# Patient Record
Sex: Male | Born: 1990 | Race: Black or African American | Hispanic: No | Marital: Single | Smoking: Never smoker
Health system: Southern US, Community
[De-identification: ages and names within clinical notes are randomized; demographics above are authoritative.]

## PROBLEM LIST (undated history)

## (undated) DIAGNOSIS — A549 Gonococcal infection, unspecified: Secondary | ICD-10-CM

## (undated) DIAGNOSIS — L309 Dermatitis, unspecified: Secondary | ICD-10-CM

## (undated) DIAGNOSIS — A749 Chlamydial infection, unspecified: Secondary | ICD-10-CM

---
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
## 2009-07-13 ENCOUNTER — Emergency Department (HOSPITAL_COMMUNITY): Admission: EM | Admit: 2009-07-13 | Discharge: 2009-07-13 | Payer: Self-pay | Admitting: Emergency Medicine

## 2009-08-28 ENCOUNTER — Emergency Department (HOSPITAL_COMMUNITY): Admission: EM | Admit: 2009-08-28 | Discharge: 2009-08-28 | Payer: Self-pay | Admitting: Emergency Medicine

## 2009-12-10 ENCOUNTER — Emergency Department (HOSPITAL_COMMUNITY): Admission: EM | Admit: 2009-12-10 | Discharge: 2009-12-10 | Payer: Self-pay | Admitting: Emergency Medicine

## 2010-03-14 ENCOUNTER — Emergency Department (HOSPITAL_COMMUNITY): Admission: EM | Admit: 2010-03-14 | Discharge: 2010-03-14 | Payer: Self-pay | Admitting: Family Medicine

## 2010-07-08 LAB — GC/CHLAMYDIA PROBE AMP, GENITAL
Chlamydia, DNA Probe: POSITIVE — AB
GC Probe Amp, Genital: NEGATIVE

## 2010-07-10 LAB — GC/CHLAMYDIA PROBE AMP, GENITAL: Chlamydia, DNA Probe: NEGATIVE

## 2010-07-22 ENCOUNTER — Inpatient Hospital Stay (INDEPENDENT_AMBULATORY_CARE_PROVIDER_SITE_OTHER)
Admission: RE | Admit: 2010-07-22 | Discharge: 2010-07-22 | Disposition: A | Discharge status: Home / Self Care | Payer: Self-pay | Source: Ambulatory Visit | Attending: Family Medicine | Admitting: Family Medicine

## 2010-07-22 DIAGNOSIS — R6889 Other general symptoms and signs: Secondary | ICD-10-CM

## 2010-07-28 ENCOUNTER — Inpatient Hospital Stay (INDEPENDENT_AMBULATORY_CARE_PROVIDER_SITE_OTHER)
Admission: RE | Admit: 2010-07-28 | Discharge: 2010-07-28 | Disposition: A | Discharge status: Home / Self Care | Payer: Self-pay | Source: Ambulatory Visit

## 2010-07-28 DIAGNOSIS — R6889 Other general symptoms and signs: Secondary | ICD-10-CM

## 2010-07-28 LAB — URINALYSIS, MICROSCOPIC ONLY
Bilirubin Urine: NEGATIVE
Ketones, ur: NEGATIVE mg/dL
Leukocytes, UA: NEGATIVE
Urobilinogen, UA: 0.2 mg/dL (ref 0.0–1.0)
pH: 5.5 (ref 5.0–8.0)

## 2010-07-29 LAB — GC/CHLAMYDIA PROBE AMP, GENITAL
Chlamydia, DNA Probe: NEGATIVE
GC Probe Amp, Genital: NEGATIVE

## 2010-10-05 ENCOUNTER — Inpatient Hospital Stay (INDEPENDENT_AMBULATORY_CARE_PROVIDER_SITE_OTHER)
Admission: RE | Admit: 2010-10-05 | Discharge: 2010-10-05 | Disposition: A | Discharge status: Home / Self Care | Payer: Self-pay | Source: Ambulatory Visit | Attending: Family Medicine | Admitting: Family Medicine

## 2010-10-05 DIAGNOSIS — R369 Urethral discharge, unspecified: Secondary | ICD-10-CM

## 2010-10-07 LAB — GC/CHLAMYDIA PROBE AMP, GENITAL
Chlamydia, DNA Probe: NEGATIVE
GC Probe Amp, Genital: NEGATIVE

## 2010-11-17 ENCOUNTER — Inpatient Hospital Stay (HOSPITAL_COMMUNITY)
Admission: RE | Admit: 2010-11-17 | Discharge: 2010-11-17 | Disposition: A | Discharge status: Home / Self Care | Payer: Self-pay | Source: Ambulatory Visit | Attending: Emergency Medicine | Admitting: Emergency Medicine

## 2011-02-04 LAB — GC/CHLAMYDIA PROBE AMP, GENITAL: GC Probe Amp, Genital: POSITIVE — AB

## 2011-07-06 ENCOUNTER — Encounter (HOSPITAL_COMMUNITY): Payer: Self-pay

## 2011-07-06 ENCOUNTER — Emergency Department (INDEPENDENT_AMBULATORY_CARE_PROVIDER_SITE_OTHER)
Admission: EM | Admit: 2011-07-06 | Discharge: 2011-07-06 | Disposition: A | Discharge status: Home / Self Care | Payer: Self-pay | Source: Home / Self Care | Attending: Emergency Medicine | Admitting: Emergency Medicine

## 2011-07-06 DIAGNOSIS — A749 Chlamydial infection, unspecified: Secondary | ICD-10-CM

## 2011-07-06 DIAGNOSIS — A7489 Other chlamydial diseases: Secondary | ICD-10-CM

## 2011-07-06 HISTORY — DX: Chlamydial infection, unspecified: A74.9

## 2011-07-06 HISTORY — DX: Dermatitis, unspecified: L30.9

## 2011-07-06 HISTORY — DX: Gonococcal infection, unspecified: A54.9

## 2011-07-06 LAB — URINE MICROSCOPIC-ADD ON

## 2011-07-06 LAB — URINALYSIS, ROUTINE W REFLEX MICROSCOPIC: Glucose, UA: NEGATIVE mg/dL

## 2011-07-06 MED ORDER — CEFTRIAXONE SODIUM 250 MG IJ SOLR
INTRAMUSCULAR | Status: AC
  Filled 2011-07-06: qty 250

## 2011-07-06 MED ORDER — AZITHROMYCIN 250 MG PO TABS
1000.0000 mg | ORAL_TABLET | Freq: Once | ORAL | Status: AC
  Administered 2011-07-06: 1000 mg via ORAL

## 2011-07-06 MED ORDER — CEFTRIAXONE SODIUM 250 MG IJ SOLR: 250.0000 mg | Freq: Once | INTRAMUSCULAR | Status: DC

## 2011-07-06 MED ORDER — AZITHROMYCIN 250 MG PO TABS
ORAL_TABLET | ORAL | Status: AC
  Filled 2011-07-06: qty 4

## 2011-07-06 MED ORDER — LIDOCAINE HCL (PF) 1 % IJ SOLN
INTRAMUSCULAR | Status: AC
  Filled 2011-07-06: qty 5

## 2011-07-06 NOTE — ED Notes (Signed)
states his partner informed him last week of chlamydia finding; pt states he is not having any symptoms; also out of his skin cream for excema

## 2011-07-06 NOTE — Discharge Instructions (Signed)
Take the medication as written. Give [place] a working phone number so that we can contact you if needed. Refrain from sexual contact until you know your results and your partner(s) are treated. Return if you get worse, have a fever >100.4, or for any concerns.   Go to www.goodrx.com to look up your medications. This will give you a list of where you can find your prescriptions at the most affordable prices.    Go to www.goodrx.com to look up your medications. This will give you a list of where you can find your prescriptions at the most affordable prices.   RESOURCE GUIDE   Insufficient Money for Medicine Contact United Way:  call "211" or [facility] [phone].  No Primary Care Doctor Call [facility]  [phone] Other agencies that provide inexpensive medical care    [facility]  [phone]    [facility]  [phone]    [facility]  [phone]    [facility]  [phone] [address] [place] [address]    [facility]  [phone]    [facility]  [phone] [facility] [phone]   [address], [place] [place], [place] [address]   [facility] Resources  [facility]     [employer]                          [facility] Dept. [address]  [place]                       [address]      [address] [place] [address]   [phone] (After Hours)

## 2011-07-06 NOTE — ED Notes (Signed)
Please call pt at : [phone] for lab reports

## 2011-07-06 NOTE — ED Provider Notes (Signed)
History     CSN: [id number]  Arrival date & time 07/06/11  1021   First MD Initiated Contact with Patient 07/06/11 1200      Chief Complaint  Patient presents with  . Rash    (Consider location/radiation/quality/duration/timing/severity/associated sxs/prior treatment) HPI Comments: Pt states that his male sexual partner was diagnosed with Chlamydia last week. Does not know if she has any other diagnosis. Patient without symptoms - No urgency, frequency, dysuria, oderous urine, hematuria,  genital blisters, penile itching. No fevers, N/V, abd pain, back pain.  Intermittently uses  condoms. STD's  a concern today. Patient history of Chlamydia, gonorrhea. No history of trichomoniasis, syphilis, herpes, HIV.   ROS as noted in HPI. All other ROS negative.     Patient is a 21 y.o. male presenting with STD exposure. The history is provided by the patient.  Exposure to STD This is a new problem. The current episode started more than 1 week ago. The problem has not changed since onset.Pertinent negatives include no chest pain and no abdominal pain. The symptoms are aggravated by nothing. The symptoms are relieved by nothing. He has tried nothing for the symptoms. The treatment provided no relief.    Past Medical History  Diagnosis Date  . Gonorrhea   . Chlamydia   . Eczema     History reviewed. No pertinent past surgical history.  History reviewed. No pertinent family history.  History  Substance Use Topics  . Smoking status: Never Smoker   . Smokeless tobacco: Not on file  . Alcohol Use: No      Review of Systems  Cardiovascular: Negative for chest pain.  Gastrointestinal: Negative for abdominal pain.    Allergies  Review of patient's allergies indicates no known allergies.  Home Medications  No current outpatient prescriptions on file.  BP 109/64  Pulse 68  Temp(Src) 97.2 F (36.2 C) (Oral)  Resp 16  SpO2 96%  Physical Exam  Nursing note and vitals  reviewed. Constitutional: He is oriented to person, place, and time. He appears well-developed and well-nourished.  HENT:  Head: Normocephalic and atraumatic.  Eyes: Conjunctivae and EOM are normal.  Neck: Normal range of motion.  Cardiovascular: Regular rhythm.   Pulmonary/Chest: Effort normal. No respiratory distress.  Abdominal: He exhibits no distension.  Genitourinary: Testes normal. Circumcised. No penile erythema or penile tenderness. Discharge found.       Thin watery grey penile discharge.  no rash. Pt declined chaperone.   Musculoskeletal: Normal range of motion.  Lymphadenopathy:       Right: No inguinal adenopathy present.       Left: No inguinal adenopathy present.  Neurological: He is alert and oriented to person, place, and time.  Skin: Skin is warm and dry. No rash noted.  Psychiatric: He has a normal mood and affect. His behavior is normal.    ED Course  Procedures (including critical care time)   Labs Reviewed  GC/CHLAMYDIA PROBE AMP, URINE  URINALYSIS, ROUTINE W REFLEX MICROSCOPIC   No results found.   1. Chlamydia       MDM  Previous labs reviewed. Patient positive for Chlamydia twice, and gonorrhea once in 2011. RPR and HIV were negative in 2011.  H&P most c/w  chlamydia. Sent off GC/chlamydia, urine microscopy to look for trichomonas. Will  treat empirically now. Giving ceftriaxone 250 mg IM/azithro 1 gm po.  Advised pt to refrain from sexual contact until she he knows lab results, symptoms resolve, and partner(s) are treated. Pt  provided working phone number. Pt agrees.     Luiz Blare, MD 07/06/11 1257

## 2011-07-07 LAB — GC/CHLAMYDIA PROBE AMP, URINE
Chlamydia, Swab/Urine, PCR: POSITIVE — AB
GC Probe Amp, Urine: NEGATIVE

## 2011-07-08 ENCOUNTER — Telehealth (HOSPITAL_COMMUNITY): Payer: Self-pay | Admitting: *Deleted

## 2011-07-08 NOTE — ED Notes (Signed)
GC neg., Chlamydia pos.  Pt. Was adequately treated with Zithromax.  Pt. verified x 2 and given results. Pt. Told he was adequately treated. Pt. instructed to notify their partner, no sex for 1 week and to practice safe sex. Pt. told they can get HIV testing at the [facility]. STD clinic. Pt. Voiced understanding. Vassie Moselle 07/08/2011
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
## 2012-10-07 ENCOUNTER — Emergency Department (HOSPITAL_COMMUNITY)
Admission: EM | Admit: 2012-10-07 | Discharge: 2012-10-07 | Disposition: A | Discharge status: Home / Self Care | Payer: Self-pay | Source: Home / Self Care | Attending: Emergency Medicine | Admitting: Emergency Medicine

## 2012-10-07 ENCOUNTER — Other Ambulatory Visit (HOSPITAL_COMMUNITY)
Admission: RE | Admit: 2012-10-07 | Discharge: 2012-10-07 | Disposition: A | Discharge status: Home / Self Care | Payer: Self-pay | Source: Ambulatory Visit | Attending: Emergency Medicine | Admitting: Emergency Medicine

## 2012-10-07 ENCOUNTER — Encounter (HOSPITAL_COMMUNITY): Payer: Self-pay | Admitting: Emergency Medicine

## 2012-10-07 DIAGNOSIS — Z113 Encounter for screening for infections with a predominantly sexual mode of transmission: Secondary | ICD-10-CM | POA: Insufficient documentation

## 2012-10-07 DIAGNOSIS — A7489 Other chlamydial diseases: Secondary | ICD-10-CM

## 2012-10-07 DIAGNOSIS — Z202 Contact with and (suspected) exposure to infections with a predominantly sexual mode of transmission: Secondary | ICD-10-CM

## 2012-10-07 DIAGNOSIS — A749 Chlamydial infection, unspecified: Secondary | ICD-10-CM

## 2012-10-07 MED ORDER — AZITHROMYCIN 250 MG PO TABS
ORAL_TABLET | ORAL | Status: AC
  Filled 2012-10-07: qty 4

## 2012-10-07 MED ORDER — LIDOCAINE HCL (PF) 1 % IJ SOLN
INTRAMUSCULAR | Status: AC
  Filled 2012-10-07: qty 5

## 2012-10-07 MED ORDER — AZITHROMYCIN 250 MG PO TABS
1000.0000 mg | ORAL_TABLET | Freq: Once | ORAL | Status: AC
  Administered 2012-10-07: 1000 mg via ORAL

## 2012-10-07 MED ORDER — CEFTRIAXONE SODIUM 250 MG IJ SOLR
250.0000 mg | Freq: Once | INTRAMUSCULAR | Status: AC
  Administered 2012-10-07: 250 mg via INTRAMUSCULAR

## 2012-10-07 MED ORDER — CEFTRIAXONE SODIUM 250 MG IJ SOLR
INTRAMUSCULAR | Status: AC
  Filled 2012-10-07: qty 250
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
## 2012-10-07 NOTE — ED Notes (Signed)
Pt is here to be checked for STD... Reports partner is being treated for chlamydia He is asymptomatic, alert w/no signs of acute distress.

## 2012-10-08 LAB — HIV ANTIBODY (ROUTINE TESTING W REFLEX): HIV: NONREACTIVE

## 2012-10-10 ENCOUNTER — Telehealth (HOSPITAL_COMMUNITY): Payer: Self-pay | Admitting: *Deleted

## 2012-10-10 NOTE — ED Notes (Signed)
GC neg., Chlamydia pos., Trich neg., Pt. adequately treated with Zithromax and also got Rocephin. I called pt. and left message to call. Vassie Moselle 10/10/2012

## 2012-10-11 ENCOUNTER — Telehealth (HOSPITAL_COMMUNITY): Payer: Self-pay | Admitting: *Deleted

## 2012-10-11 NOTE — ED Notes (Signed)
I called pt.  Pt. verified x 2 and given results.  Pt. told he was adequately treated with Rocephin and Zithromax.  Pt. instructed to notify their partner, no sex for 1 week and to practice safe sex. Pt. told he should get HIV rechecked in 6 mos. at the [facility] STD clinic, by appointment. Pt. voiced understanding.  DHHS form completed and faxed to the [facility] Department. Vassie Moselle 10/11/2012

## 2012-12-16 ENCOUNTER — Other Ambulatory Visit (HOSPITAL_COMMUNITY)
Admission: RE | Admit: 2012-12-16 | Discharge: 2012-12-16 | Disposition: A | Discharge status: Home / Self Care | Payer: Self-pay | Source: Ambulatory Visit | Attending: Family Medicine | Admitting: Family Medicine

## 2012-12-16 ENCOUNTER — Emergency Department (INDEPENDENT_AMBULATORY_CARE_PROVIDER_SITE_OTHER)
Admission: EM | Admit: 2012-12-16 | Discharge: 2012-12-16 | Disposition: A | Discharge status: Home / Self Care | Payer: Self-pay | Source: Home / Self Care | Attending: Family Medicine | Admitting: Family Medicine

## 2012-12-16 ENCOUNTER — Encounter (HOSPITAL_COMMUNITY): Payer: Self-pay | Admitting: Emergency Medicine

## 2012-12-16 DIAGNOSIS — Z202 Contact with and (suspected) exposure to infections with a predominantly sexual mode of transmission: Secondary | ICD-10-CM

## 2012-12-16 DIAGNOSIS — Z113 Encounter for screening for infections with a predominantly sexual mode of transmission: Secondary | ICD-10-CM | POA: Insufficient documentation

## 2012-12-16 MED ORDER — AZITHROMYCIN 250 MG PO TABS
1000.0000 mg | ORAL_TABLET | Freq: Every day | ORAL | Status: DC
  Administered 2012-12-16: 1000 mg via ORAL

## 2012-12-16 MED ORDER — AZITHROMYCIN 250 MG PO TABS
ORAL_TABLET | ORAL | Status: AC
  Filled 2012-12-16: qty 4

## 2012-12-16 NOTE — ED Notes (Signed)
Pt states girl friend tested positive for chlamydia. Pt denies any symptoms at this time.  Needs treatment.

## 2012-12-16 NOTE — ED Provider Notes (Signed)
  CSN: [id number]     Arrival date & time 12/16/12  1007 History     First MD Initiated Contact with Patient 12/16/12 1119     Chief Complaint  Patient presents with  . Exposure to STD    girlfriend positive for ct   (Consider location/radiation/quality/duration/timing/severity/associated sxs/prior Treatment) HPI Comments: 22 year old male with history of gonorrhea Chlamydia in the past. Last time treated in June this year. Condoms concerned as his girlfriend tested positive for chlamydia can this week. Wash to be checked and treated. Denies urethral discharge. Denies burning on urination. Denies any other symptoms.   Past Medical History  Diagnosis Date  . Gonorrhea   . Chlamydia   . Eczema    History reviewed. No pertinent past surgical history. History reviewed. No pertinent family history. History  Substance Use Topics  . Smoking status: Never Smoker   . Smokeless tobacco: Not on file  . Alcohol Use: No    Review of Systems  Gastrointestinal: Negative for nausea, vomiting and abdominal pain.  Genitourinary: Negative for dysuria, flank pain, discharge, penile pain and testicular pain.  Skin: Negative for rash.  Neurological: Negative for headaches.    Allergies  Review of patient's allergies indicates no known allergies.  Home Medications  No current outpatient prescriptions on file. There were no vitals taken for this visit. Physical Exam  Nursing note and vitals reviewed. Constitutional: He is oriented to person, place, and time. He appears well-developed and well-nourished. No distress.  HENT:  Head: Normocephalic and atraumatic.  Eyes: No scleral icterus.  Cardiovascular: Normal heart sounds.   Pulmonary/Chest: Breath sounds normal.  Abdominal: Soft. There is no tenderness.  Genitourinary: Testes normal and penis normal.  Lymphadenopathy:       Right: No inguinal adenopathy present.       Left: No inguinal adenopathy present.  Neurological: He is alert and  oriented to person, place, and time.  Skin: No rash noted. He is not diaphoretic.    ED Course   Procedures (including critical care time)  Labs Reviewed - No data to display No results found. 1. Exposure to STD     MDM  Treat empirically with azithromycin 1 g oral x1. GC and Chlamydia pending at the time of discharge. Supportive care and red flags that should prompt his return to medical attention discussed with patient and provided in writing.  Sharin Grave, MD 12/16/12 1144

## 2014-03-06 ENCOUNTER — Encounter (HOSPITAL_COMMUNITY): Payer: Self-pay | Admitting: *Deleted

## 2014-03-06 ENCOUNTER — Other Ambulatory Visit (HOSPITAL_COMMUNITY)
Admission: RE | Admit: 2014-03-06 | Discharge: 2014-03-06 | Disposition: A | Discharge status: Home / Self Care | Payer: Self-pay | Source: Ambulatory Visit | Attending: Family Medicine | Admitting: Family Medicine

## 2014-03-06 ENCOUNTER — Emergency Department (INDEPENDENT_AMBULATORY_CARE_PROVIDER_SITE_OTHER)
Admission: EM | Admit: 2014-03-06 | Discharge: 2014-03-06 | Disposition: A | Discharge status: Home / Self Care | Payer: Self-pay | Source: Home / Self Care | Attending: Family Medicine | Admitting: Family Medicine

## 2014-03-06 DIAGNOSIS — Z113 Encounter for screening for infections with a predominantly sexual mode of transmission: Secondary | ICD-10-CM | POA: Insufficient documentation

## 2014-03-06 DIAGNOSIS — N342 Other urethritis: Secondary | ICD-10-CM

## 2014-03-06 LAB — URINALYSIS, ROUTINE W REFLEX MICROSCOPIC
Bilirubin Urine: NEGATIVE
GLUCOSE, UA: NEGATIVE mg/dL
HGB URINE DIPSTICK: NEGATIVE
KETONES UR: NEGATIVE mg/dL
LEUKOCYTES UA: NEGATIVE
Nitrite: NEGATIVE
PROTEIN: NEGATIVE mg/dL
Specific Gravity, Urine: 1.013 (ref 1.005–1.030)
UROBILINOGEN UA: 0.2 mg/dL (ref 0.0–1.0)
pH: 5.5 (ref 5.0–8.0)

## 2014-03-06 LAB — POCT URINALYSIS DIP (DEVICE)
BILIRUBIN URINE: NEGATIVE
GLUCOSE, UA: NEGATIVE mg/dL
Ketones, ur: NEGATIVE mg/dL
Leukocytes, UA: NEGATIVE
Nitrite: NEGATIVE
PH: 5.5 (ref 5.0–8.0)
Protein, ur: NEGATIVE mg/dL
SPECIFIC GRAVITY, URINE: 1.015 (ref 1.005–1.030)
Urobilinogen, UA: 0.2 mg/dL (ref 0.0–1.0)

## 2014-03-06 MED ORDER — AZITHROMYCIN 250 MG PO TABS
1000.0000 mg | ORAL_TABLET | Freq: Once | ORAL | Status: AC
  Administered 2014-03-06: 1000 mg via ORAL

## 2014-03-06 MED ORDER — CEFTRIAXONE SODIUM 250 MG IJ SOLR
250.0000 mg | Freq: Once | INTRAMUSCULAR | Status: AC
  Administered 2014-03-06: 250 mg via INTRAMUSCULAR

## 2014-03-06 MED ORDER — AZITHROMYCIN 250 MG PO TABS
ORAL_TABLET | ORAL | Status: AC
  Filled 2014-03-06: qty 4

## 2014-03-06 MED ORDER — CEFTRIAXONE SODIUM 250 MG IJ SOLR
INTRAMUSCULAR | Status: AC
  Filled 2014-03-06: qty 250

## 2014-03-06 NOTE — ED Notes (Signed)
Pt  Reports  Symptoms  Of  Dysuria         With    Blood  When he  Urinates           He       denys  Any  Lesions              Or  [place]

## 2014-03-06 NOTE — Discharge Instructions (Signed)
You likely have a urethritis which is irritation of the urethra. We treated you with antibiotics for this.    Urethritis Urethritis is an inflammation of the tube through which urine exits your bladder (urethra).  CAUSES Urethritis is often caused by an infection in your urethra. The infection can be viral, like herpes. The infection can also be bacterial, like gonorrhea. RISK FACTORS Risk factors of urethritis include:  Having sex without using a condom.  Having multiple sexual partners.  Having poor hygiene. SIGNS AND SYMPTOMS Symptoms of urethritis are less noticeable in women than in men. These symptoms include:  Burning feeling when you urinate (dysuria).  Discharge from your urethra.  Blood in your urine (hematuria).  Urinating more than usual. DIAGNOSIS  To confirm a diagnosis of urethritis, your health care provider will do the following:  Ask about your sexual history.  Perform a physical exam.  Have you provide a sample of your urine for lab testing.  Use a cotton swab to gently collect a sample from your urethra for lab testing. TREATMENT  It is important to treat urethritis. Depending on the cause, untreated urethritis may lead to serious genital infections and possibly infertility. Urethritis caused by a bacterial infection is treated with antibiotic medicine. All sexual partners must be treated.  HOME CARE INSTRUCTIONS  Do not have sex until the test results are known and treatment is completed, even if your symptoms go away before you finish treatment.  If you were prescribed an antibiotic, finish it all even if you start to feel better. SEEK MEDICAL CARE IF:   Your symptoms are not improved in 3 days.  Your symptoms are getting worse.  You develop abdominal pain or pelvic pain (in women).  You develop joint pain.  You have a fever. SEEK IMMEDIATE MEDICAL CARE IF:   You have severe pain in the belly, back, or side.  You have repeated  vomiting. MAKE SURE YOU:  Understand these instructions.  Will watch your condition.  Will get help right away if you are not doing well or get worse. Document Released: 10/07/2000 Document Revised: 08/28/2013 Document Reviewed: 12/12/2012 [facility] Patient Information 2015 [place]. This information is not intended to replace advice given to you by your health care provider. Make sure you discuss any questions you have with your health care provider.
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
## 2014-03-08 ENCOUNTER — Telehealth: Payer: Self-pay | Admitting: Family Medicine

## 2014-03-08 LAB — URINE CYTOLOGY ANCILLARY ONLY
Chlamydia: POSITIVE — AB
Neisseria Gonorrhea: NEGATIVE
Trichomonas: NEGATIVE

## 2014-03-08 NOTE — Telephone Encounter (Signed)
[facility].  We will not be able to treat here. Marc Watkins, Marc Watkins

## 2014-03-08 NOTE — Telephone Encounter (Signed)
Unable to reach patient at listed number "No longer in service." Please attempt to call this number again later today and if unable to reach patient directly, we can send a letter asking patient to contact office for the message and provide working number.   If reached, let patient know that testing was positive for chlamydia and he should come into clinic for nurse visit to receive azithromycin 1000mg  PO x 1 and ceftriaxone 250mg  IM x 1. He should refrain from sexual activity until treated and partner is treated. He should also consider testing for other STDs including HIV and RPR.  Thank you,  Leona SingletonMaria T Aminta Sakurai, MD

## 2014-03-09 NOTE — Telephone Encounter (Signed)
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 Will fwd to Dr Artis FlockKindl. Thank you.  Leona SingletonMaria T Shelisha Gautier, MD

## 2014-03-14 ENCOUNTER — Telehealth (HOSPITAL_COMMUNITY): Payer: Self-pay | Admitting: *Deleted

## 2014-03-14 NOTE — ED Notes (Signed)
GC and Trich neg.  Chlamydia pos.  Pt. adequately treated with Zithromax and also got Rocephin.  I called pt. but pt.'s number and contacts number are not in service.  Confidential marked letter sent with results and instructions to notify partner and practice safe sex.  [facility]HS [facility]. Vassie MoselleYork, [place] Brasington M 03/14/2014

## 2015-02-27 ENCOUNTER — Encounter (HOSPITAL_COMMUNITY): Payer: Self-pay | Admitting: *Deleted

## 2015-02-27 ENCOUNTER — Emergency Department (HOSPITAL_COMMUNITY): Payer: Self-pay

## 2015-02-27 ENCOUNTER — Emergency Department (HOSPITAL_COMMUNITY)
Admission: EM | Admit: 2015-02-27 | Discharge: 2015-02-27 | Disposition: A | Discharge status: Home / Self Care | Payer: Self-pay | Attending: Emergency Medicine | Admitting: Emergency Medicine

## 2015-02-27 DIAGNOSIS — Y9289 Other specified places as the place of occurrence of the external cause: Secondary | ICD-10-CM | POA: Insufficient documentation

## 2015-02-27 DIAGNOSIS — Z872 Personal history of diseases of the skin and subcutaneous tissue: Secondary | ICD-10-CM | POA: Insufficient documentation

## 2015-02-27 DIAGNOSIS — Y9389 Activity, other specified: Secondary | ICD-10-CM | POA: Insufficient documentation

## 2015-02-27 DIAGNOSIS — Y998 Other external cause status: Secondary | ICD-10-CM | POA: Insufficient documentation

## 2015-02-27 DIAGNOSIS — Z23 Encounter for immunization: Secondary | ICD-10-CM | POA: Insufficient documentation

## 2015-02-27 DIAGNOSIS — S61411A Laceration without foreign body of right hand, initial encounter: Secondary | ICD-10-CM | POA: Insufficient documentation

## 2015-02-27 DIAGNOSIS — Z8619 Personal history of other infectious and parasitic diseases: Secondary | ICD-10-CM | POA: Insufficient documentation

## 2015-02-27 MED ORDER — MORPHINE SULFATE (PF) 4 MG/ML IV SOLN
4.0000 mg | Freq: Once | INTRAVENOUS | Status: AC
  Administered 2015-02-27: 4 mg via INTRAVENOUS
  Filled 2015-02-27: qty 1

## 2015-02-27 MED ORDER — IBUPROFEN 600 MG PO TABS: 600.0000 mg | ORAL_TABLET | Freq: Four times a day (QID) | ORAL | Status: AC | PRN

## 2015-02-27 MED ORDER — LIDOCAINE HCL 2 % IJ SOLN
10.0000 mL | Freq: Once | INTRAMUSCULAR | Status: AC
  Administered 2015-02-27: 200 mg
  Filled 2015-02-27: qty 20

## 2015-02-27 MED ORDER — TETANUS-DIPHTH-ACELL PERTUSSIS 5-2.5-18.5 LF-MCG/0.5 IM SUSP
0.5000 mL | Freq: Once | INTRAMUSCULAR | Status: AC
  Administered 2015-02-27: 0.5 mL via INTRAMUSCULAR
  Filled 2015-02-27: qty 0.5

## 2015-02-27 MED ORDER — HYDROCODONE-ACETAMINOPHEN 5-325 MG PO TABS
2.0000 | ORAL_TABLET | Freq: Once | ORAL | Status: AC
  Administered 2015-02-27: 2 via ORAL
  Filled 2015-02-27: qty 2

## 2015-02-27 MED ORDER — HYDROCODONE-ACETAMINOPHEN 5-325 MG PO TABS
1.0000 | ORAL_TABLET | ORAL | Status: AC | PRN
Start: 2015-02-27 — End: ?

## 2015-02-27 MED ORDER — ONDANSETRON HCL 4 MG PO TABS
4.0000 mg | ORAL_TABLET | Freq: Once | ORAL | Status: AC
  Administered 2015-02-27: 4 mg via ORAL
  Filled 2015-02-27: qty 1
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
## 2015-02-27 NOTE — Progress Notes (Signed)
Orthopedic Tech Progress Note Patient Details:  Driscilla Grammesykee Xxxmitchell 10/14/1990 [id number]  Ortho Devices Type of Ortho Device: Ace wrap, Volar splint Ortho Device/Splint Location: RUE Ortho Device/Splint Interventions: Ordered, Application   Jennye MoccasinHughes, Jermarcus Mcfadyen Craig 02/27/2015, 8:24 PM
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
## 2015-02-27 NOTE — Discharge Instructions (Signed)
Keep your splint dry. Return without fail for worsening symptoms, including worsening pain, new numbness or weakness, fevers, or any other symptoms concerning to you. Please call the surgeon listed above to set up a follow-up appointment for the end of the week for definitive repair of your wound.  Laceration Care, Adult A laceration is a cut that goes through all of the layers of the skin and into the tissue that is right under the skin. Some lacerations heal on their own. Others need to be closed with stitches (sutures), staples, skin adhesive strips, or skin glue. Proper laceration care minimizes the risk of infection and helps the laceration to heal better. HOW TO CARE FOR YOUR LACERATION If sutures or staples were used:  Keep the wound clean and dry.  If you were given a bandage (dressing), you should change it at least one time per day or as told by your health care provider. You should also change it if it becomes wet or dirty.  Keep the wound completely dry for the first 24 hours or as told by your health care provider. After that time, you may shower or bathe. However, make sure that the wound is not soaked in water until after the sutures or staples have been removed.  Clean the wound one time each day or as told by your health care provider:  Wash the wound with soap and water.  Rinse the wound with water to remove all soap.  Pat the wound dry with a clean towel. Do not rub the wound.  After cleaning the wound, apply a thin layer of antibiotic ointmentas told by your health care provider. This will help to prevent infection and keep the dressing from sticking to the wound.  Have the sutures or staples removed as told by your health care provider. If skin adhesive strips were used:  Keep the wound clean and dry.  If you were given a bandage (dressing), you should change it at least one time per day or as told by your health care provider. You should also change it if it becomes  dirty or wet.  Do not get the skin adhesive strips wet. You may shower or bathe, but be careful to keep the wound dry.  If the wound gets wet, pat it dry with a clean towel. Do not rub the wound.  Skin adhesive strips fall off on their own. You may trim the strips as the wound heals. Do not remove skin adhesive strips that are still stuck to the wound. They will fall off in time. If skin glue was used:  Try to keep the wound dry, but you may briefly wet it in the shower or bath. Do not soak the wound in water, such as by swimming.  After you have showered or bathed, gently pat the wound dry with a clean towel. Do not rub the wound.  Do not do any activities that will make you sweat heavily until the skin glue has fallen off on its own.  Do not apply liquid, cream, or ointment medicine to the wound while the skin glue is in place. Using those may loosen the film before the wound has healed.  If you were given a bandage (dressing), you should change it at least one time per day or as told by your health care provider. You should also change it if it becomes dirty or wet.  If a dressing is placed over the wound, be careful not to apply tape directly  over the skin glue. Doing that may cause the glue to be pulled off before the wound has healed.  Do not pick at the glue. The skin glue usually remains in place for 5-10 days, then it falls off of the skin. General Instructions  Take over-the-counter and prescription medicines only as told by your health care provider.  If you were prescribed an antibiotic medicine or ointment, take or apply it as told by your doctor. Do not stop using it even if your condition improves.  To help prevent scarring, make sure to cover your wound with sunscreen whenever you are outside after stitches are removed, after adhesive strips are removed, or when glue remains in place and the wound is healed. Make sure to wear a sunscreen of at least 30 SPF.  Do not  scratch or pick at the wound.  Keep all follow-up visits as told by your health care provider. This is important.  Check your wound every day for signs of infection. Watch for:  Redness, swelling, or pain.  Fluid, blood, or pus.  Raise (elevate) the injured area above the level of your heart while you are sitting or lying down, if possible. SEEK MEDICAL CARE IF:  You received a tetanus shot and you have swelling, severe pain, redness, or bleeding at the injection site.  You have a fever.  A wound that was closed breaks open.  You notice a bad smell coming from your wound or your dressing.  You notice something coming out of the wound, such as wood or glass.  Your pain is not controlled with medicine.  You have increased redness, swelling, or pain at the site of your wound.  You have fluid, blood, or pus coming from your wound.  You notice a change in the color of your skin near your wound.  You need to change the dressing frequently due to fluid, blood, or pus draining from the wound.  You develop a new rash.  You develop numbness around the wound. SEEK IMMEDIATE MEDICAL CARE IF:  You develop severe swelling around the wound.  Your pain suddenly increases and is severe.  You develop painful lumps near the wound or on skin that is anywhere on your body.  You have a red streak going away from your wound.  The wound is on your hand or foot and you cannot properly move a finger or toe.  The wound is on your hand or foot and you notice that your fingers or toes look pale or bluish.   This information is not intended to replace advice given to you by your health care provider. Make sure you discuss any questions you have with your health care provider.   Document Released: 04/13/2005 Document Revised: 08/28/2014 Document Reviewed: 04/09/2014 Elsevier Interactive Patient Education [employer]

## 2015-10-09 ENCOUNTER — Ambulatory Visit (HOSPITAL_COMMUNITY)
Admission: EM | Admit: 2015-10-09 | Discharge: 2015-10-09 | Disposition: A | Discharge status: Home / Self Care | Payer: Self-pay | Attending: Emergency Medicine | Admitting: Emergency Medicine

## 2015-10-09 ENCOUNTER — Encounter (HOSPITAL_COMMUNITY): Payer: Self-pay | Admitting: Emergency Medicine

## 2015-10-09 DIAGNOSIS — R369 Urethral discharge, unspecified: Secondary | ICD-10-CM | POA: Insufficient documentation

## 2015-10-09 DIAGNOSIS — Z711 Person with feared health complaint in whom no diagnosis is made: Secondary | ICD-10-CM

## 2015-10-09 MED ORDER — CEFTRIAXONE SODIUM 250 MG IJ SOLR
250.0000 mg | Freq: Once | INTRAMUSCULAR | Status: AC
  Administered 2015-10-09: 250 mg via INTRAMUSCULAR

## 2015-10-09 MED ORDER — CEFTRIAXONE SODIUM 250 MG IJ SOLR
INTRAMUSCULAR | Status: AC
  Filled 2015-10-09: qty 250

## 2015-10-09 MED ORDER — AZITHROMYCIN 250 MG PO TABS
ORAL_TABLET | ORAL | Status: AC
  Filled 2015-10-09: qty 4

## 2015-10-09 MED ORDER — AZITHROMYCIN 250 MG PO TABS
1000.0000 mg | ORAL_TABLET | Freq: Once | ORAL | Status: AC
  Administered 2015-10-09: 1000 mg via ORAL

## 2015-10-09 NOTE — ED Notes (Signed)
PT reports he is having white penile discharge. Pt denies any pain. PT did have a sexual encounter with a new partner recently. PT reports he feels as though he has lower abdominal bloating.

## 2015-10-09 NOTE — ED Provider Notes (Signed)
CSN: [id number]     Arrival date & time 10/09/15  1337 History   First MD Initiated Contact with Patient 10/09/15 1455     Chief Complaint  Patient presents with  . SEXUALLY TRANSMITTED DISEASE   (Consider location/radiation/quality/duration/timing/severity/associated sxs/prior Treatment) HPI Marc Watkins is a 25 y.o. male presenting to UC with c/o white penile discharge that started about 3 days ago after having intercourse with a new partner last week.  He reports associated lower abdominal discomfort and bloating.  He did wear a condom but states it moved.  Denies fever, chills, n/v/d. Denies rash or lesions on genitalia. Pt requesting to be tested and treated for STDs.   Past Medical History  Diagnosis Date  . Gonorrhea   . Chlamydia   . Eczema    History reviewed. No pertinent past surgical history. No family history on file. Social History  Substance Use Topics  . Smoking status: Never Smoker   . Smokeless tobacco: None  . Alcohol Use: No    Review of Systems  Constitutional: Negative for fever, chills and appetite change.  Gastrointestinal: Positive for abdominal pain ( discomfort). Negative for nausea, vomiting and diarrhea.  Genitourinary: Positive for discharge. Negative for dysuria, urgency, frequency, hematuria, flank pain, decreased urine volume, penile swelling, scrotal swelling, genital sores, penile pain and testicular pain.  Musculoskeletal: Negative for myalgias and back pain.    Allergies  Review of patient's allergies indicates no known allergies.  Home Medications   Prior to Admission medications   Medication Sig Start Date End Date Taking? Authorizing Provider  HYDROcodone-acetaminophen (NORCO/VICODIN) 5-325 MG tablet Take 1-2 tablets by mouth every 4 (four) hours as needed. 02/27/15   Lavera Guiseana Duo Liu, MD  ibuprofen (ADVIL,MOTRIN) 600 MG tablet Take 1 tablet (600 mg total) by mouth every 6 (six) hours as needed. 02/27/15   Lavera Guiseana Duo Liu, MD   Meds Ordered  and Administered this Visit   Medications  azithromycin [facility](ZITHROMAX) tablet 1,000 mg (1,000 mg Oral Given 10/09/15 1551)  cefTRIAXone (ROCEPHIN) injection 250 mg (250 mg Intramuscular Given 10/09/15 1552)    BP 133/73 mmHg  Pulse 69  Temp(Src) 98.5 F (36.9 C) (Oral)  Resp 16  SpO2 100% No data found.   Physical Exam  Constitutional: He is oriented to person, place, and time. He appears well-developed and well-nourished.  HENT:  Head: Normocephalic and atraumatic.  Eyes: EOM are normal.  Neck: Normal range of motion.  Cardiovascular: Normal rate.   Pulmonary/Chest: Effort normal.  Abdominal: Soft. He exhibits no distension. There is no tenderness. There is no CVA tenderness.  Musculoskeletal: Normal range of motion.  Neurological: He is alert and oriented to person, place, and time.  Skin: Skin is warm and dry.  Psychiatric: He has a normal mood and affect. His behavior is normal.  Nursing note and vitals reviewed.   ED Course  Procedures (including critical care time)  Labs Review Labs Reviewed  RPR  HIV ANTIBODY (ROUTINE TESTING)  URINE CYTOLOGY ANCILLARY ONLY    Imaging Review No results found.    MDM   1. Penile discharge   2. Concern about STD in male without diagnosis    Pt requesting STD testing and treatment.  STD labs pending. Empiric treatment of Azithromycin 1g PO and Rocephin 250mg  IM given in UC  Encouraged pt to refrain from sexual intercourse for 7 days. Be sure to have all partners tested and treated for STDs.  Practice safe sex by always wearing condoms.  Junius Finner, PA-C 10/09/15 1610

## 2015-10-09 NOTE — Discharge Instructions (Signed)
°  Refrain from sexual intercourse for 7 days. Be sure to have all partners tested and treated for STDs.  Practice safe sex by always wearing condoms.  ° °

## 2015-10-10 LAB — RPR: RPR Ser Ql: NONREACTIVE

## 2015-10-10 LAB — URINE CYTOLOGY ANCILLARY ONLY
Chlamydia: POSITIVE — AB
Neisseria Gonorrhea: NEGATIVE

## 2015-10-10 LAB — HIV ANTIBODY (ROUTINE TESTING W REFLEX): HIV Screen 4th Generation wRfx: NONREACTIVE

## 2015-10-15 ENCOUNTER — Telehealth (HOSPITAL_COMMUNITY): Payer: Self-pay | Admitting: Emergency Medicine

## 2015-10-15 NOTE — ED Notes (Signed)
Called pt and notified of recent lab results from visit 6/14 Pt ID'd properly... Reports penile d/c has subsided but still having abd pain... Adv pt to come in and get it checked out.   Per Dr. Dayton ScrapeMurray,  Notes Recorded by Eustace MooreLaura W Murray, MD on 10/15/2015 at 8:40 AM Please let patient/health department know that test for chlamydia was positive.  This was treated at the Urgent Care visit 10/09/15 with 1g po zithromax and 250mg  IM rocephin. Sexual partners need to be notified and tested/treated. Tests for HIV, syphilis, gonorrhea were all negative. Recheck as needed. LM  Adv pt if sx are not getting better to return  Pt verb understanding Education on safe sex given [facility]

## 2024-03-29 ENCOUNTER — Other Ambulatory Visit: Payer: Self-pay

## 2024-03-29 ENCOUNTER — Encounter (HOSPITAL_COMMUNITY): Payer: Self-pay | Admitting: *Deleted

## 2024-03-29 ENCOUNTER — Emergency Department (HOSPITAL_COMMUNITY)
Admission: EM | Admit: 2024-03-29 | Discharge: 2024-03-30 | Payer: Self-pay | Attending: Emergency Medicine | Admitting: Emergency Medicine

## 2024-03-29 DIAGNOSIS — Z5321 Procedure and treatment not carried out due to patient leaving prior to being seen by health care provider: Secondary | ICD-10-CM | POA: Insufficient documentation

## 2024-03-29 DIAGNOSIS — K4090 Unilateral inguinal hernia, without obstruction or gangrene, not specified as recurrent: Secondary | ICD-10-CM | POA: Insufficient documentation

## 2024-03-29 LAB — CBC
HCT: 35.1 % — ABNORMAL LOW (ref 39.0–52.0)
Hemoglobin: 11.9 g/dL — ABNORMAL LOW (ref 13.0–17.0)
MCH: 32.9 pg (ref 26.0–34.0)
MCHC: 33.9 g/dL (ref 30.0–36.0)
MCV: 97 fL (ref 80.0–100.0)
Platelets: 198 K/uL (ref 150–400)
RBC: 3.62 MIL/uL — ABNORMAL LOW (ref 4.22–5.81)
RDW: 12.4 % (ref 11.5–15.5)
WBC: 5.7 K/uL (ref 4.0–10.5)
nRBC: 0 % (ref 0.0–0.2)

## 2024-03-29 MED ORDER — OXYCODONE-ACETAMINOPHEN 5-325 MG PO TABS
1.0000 | ORAL_TABLET | Freq: Once | ORAL | Status: AC
  Administered 2024-03-29: 1 via ORAL

## 2024-03-29 MED ORDER — OXYCODONE-ACETAMINOPHEN 5-325 MG PO TABS
ORAL_TABLET | ORAL | Status: AC
  Filled 2024-03-29: qty 1

## 2024-03-29 NOTE — ED Triage Notes (Signed)
 The pt has had an inquinal hernia for years that acts up intermittently  he has had pain there for 3-4 days and he is having difficulty voiding

## 2024-03-30 ENCOUNTER — Emergency Department (HOSPITAL_BASED_OUTPATIENT_CLINIC_OR_DEPARTMENT_OTHER): Payer: Self-pay

## 2024-03-30 ENCOUNTER — Other Ambulatory Visit: Payer: Self-pay

## 2024-03-30 ENCOUNTER — Encounter (HOSPITAL_BASED_OUTPATIENT_CLINIC_OR_DEPARTMENT_OTHER): Payer: Self-pay | Admitting: Emergency Medicine

## 2024-03-30 ENCOUNTER — Emergency Department (HOSPITAL_BASED_OUTPATIENT_CLINIC_OR_DEPARTMENT_OTHER)
Admission: EM | Admit: 2024-03-30 | Discharge: 2024-03-30 | Disposition: A | Discharge status: Home / Self Care | Payer: Self-pay | Attending: Emergency Medicine | Admitting: Emergency Medicine

## 2024-03-30 DIAGNOSIS — K4091 Unilateral inguinal hernia, without obstruction or gangrene, recurrent: Secondary | ICD-10-CM | POA: Insufficient documentation

## 2024-03-30 LAB — URINALYSIS, COMPLETE (UACMP) WITH MICROSCOPIC
Bacteria, UA: NONE SEEN
Bilirubin Urine: NEGATIVE
Glucose, UA: NEGATIVE mg/dL
Hgb urine dipstick: NEGATIVE
Leukocytes,Ua: NEGATIVE
Nitrite: NEGATIVE
Protein, ur: NEGATIVE mg/dL
Specific Gravity, Urine: 1.028 (ref 1.005–1.030)
pH: 6 (ref 5.0–8.0)

## 2024-03-30 LAB — COMPREHENSIVE METABOLIC PANEL WITH GFR
ALT: 19 U/L (ref 0–44)
AST: 29 U/L (ref 15–41)
Albumin: 3.4 g/dL — ABNORMAL LOW (ref 3.5–5.0)
Alkaline Phosphatase: 39 U/L (ref 38–126)
Anion gap: 8 (ref 5–15)
BUN: 10 mg/dL (ref 6–20)
CO2: 25 mmol/L (ref 22–32)
Calcium: 8.3 mg/dL — ABNORMAL LOW (ref 8.9–10.3)
Chloride: 107 mmol/L (ref 98–111)
Creatinine, Ser: 1.24 mg/dL (ref 0.61–1.24)
GFR, Estimated: >60 mL/min (ref >60)
Glucose, Bld: 126 mg/dL — ABNORMAL HIGH (ref 70–99)
Potassium: 3.3 mmol/L — ABNORMAL LOW (ref 3.5–5.1)
Sodium: 140 mmol/L (ref 135–145)
Total Bilirubin: 0.6 mg/dL (ref 0.0–1.2)
Total Protein: 6.3 g/dL — ABNORMAL LOW (ref 6.5–8.1)

## 2024-03-30 LAB — LIPASE, BLOOD: Lipase: 31 U/L (ref 11–51)

## 2024-03-30 MED ORDER — IOHEXOL 300 MG/ML  SOLN
100.0000 mL | Freq: Once | INTRAMUSCULAR | Status: AC | PRN
  Administered 2024-03-30: 100 mL via INTRAVENOUS

## 2024-03-30 MED ORDER — ONDANSETRON HCL 4 MG/2ML IJ SOLN
4.0000 mg | Freq: Once | INTRAMUSCULAR | Status: AC
  Administered 2024-03-30: 4 mg via INTRAVENOUS
  Filled 2024-03-30: qty 2

## 2024-03-30 MED ORDER — MORPHINE SULFATE (PF) 4 MG/ML IV SOLN
4.0000 mg | Freq: Once | INTRAVENOUS | Status: AC
  Administered 2024-03-30: 4 mg via INTRAVENOUS
  Filled 2024-03-30: qty 1

## 2024-03-30 NOTE — ED Triage Notes (Signed)
 Pt to ED c/o inguinal hernia that he has had intermittently for 10 years.  States 3-4 days has returned causing pain and swelling to left testicle.  Denies drainage or odor.  Denies difficulty urinating but states is scared to because of pain and swelling.

## 2024-03-30 NOTE — Discharge Instructions (Signed)
 You were seen today for concern for hernia.  You do have a small hernia in the left inguinal canal.  I was able to reduce this.  You did have some evidence of fat in the hernia.  Follow-up with general surgery.  You may need repair.  Avoid straining and heavy lifting.

## 2024-03-30 NOTE — ED Provider Notes (Signed)
 [place] EMERGENCY DEPARTMENT AT [facility] Provider Note   CSN: [id number] Arrival date & time: 03/30/24  0149     Patient presents with: Hernia   Marc Watkins is a 33 y.o. male.   HPI     This is a 33 year old male who presents with concern for hernia.  Patient reports over the last 3 days he has had increasing pain at his left groin.  He has noted some bulging.  He has also noted some swelling of the left testicle.  No dysuria or hematuria.  States that he was given a Percocet at [facility] and this made him feel better.  He left prior to being seen.  He denies any fevers, nausea, vomiting.  Reports normal bowel movements.  Prior to Admission medications   Medication Sig Start Date End Date Taking? Authorizing Provider  HYDROcodone -acetaminophen  (NORCO/VICODIN) 5-325 MG tablet Take 1-2 tablets by mouth every 4 (four) hours as needed. 02/27/15   Liu, Dana Duo, MD  ibuprofen  (ADVIL ,MOTRIN ) 600 MG tablet Take 1 tablet (600 mg total) by mouth every 6 (six) hours as needed. 02/27/15   Liu, Dana Duo, MD    Allergies: Patient has no known allergies.    Review of Systems  Constitutional:  Negative for fever.  Respiratory:  Negative for shortness of breath.   Cardiovascular:  Negative for chest pain.  Gastrointestinal:  Negative for nausea and vomiting.  Genitourinary:  Positive for scrotal swelling.  All other systems reviewed and are negative.   Updated Vital Signs BP 135/85 (BP Location: Right Arm)   Pulse 60   Temp 99.1 F (37.3 C) (Oral)   Resp 16   Ht 1.854 m (6' 1)   Wt 63.5 kg   SpO2 99%   BMI 18.47 kg/m   Physical Exam Vitals and nursing note reviewed. Exam conducted with a chaperone present.  Constitutional:      Appearance: He is well-developed. He is not ill-appearing.  HENT:     Head: Normocephalic and atraumatic.  Eyes:     Pupils: Pupils are equal, round, and reactive to light.  Cardiovascular:     Rate and Rhythm: Normal rate and  regular rhythm.     Heart sounds: Normal heart sounds. No murmur heard. Pulmonary:     Effort: Pulmonary effort is normal. No respiratory distress.     Breath sounds: No wheezing.  Abdominal:     Palpations: Abdomen is soft.     Tenderness: There is no abdominal tenderness.  Genitourinary:    Comments: Slight bulge noted left inguinal region, no overlying skin changes or erythema, reducible with gentle pressure Musculoskeletal:     Cervical back: Neck supple.  Lymphadenopathy:     Cervical: No cervical adenopathy.  Skin:    General: Skin is warm and dry.  Neurological:     Mental Status: He is alert and oriented to person, place, and time.  Psychiatric:        Mood and Affect: Mood normal.     (all labs ordered are listed, but only abnormal results are displayed) Labs Reviewed  URINALYSIS, COMPLETE (UACMP) WITH MICROSCOPIC - Abnormal; Notable for the following components:      Result Value   Ketones, ur TRACE (*)    All other components within normal limits    EKG: None  Radiology: CT ABDOMEN PELVIS W CONTRAST Result Date: 03/30/2024 EXAM: CT ABDOMEN AND PELVIS WITH CONTRAST 03/30/2024 02:37:23 AM TECHNIQUE: CT of the abdomen and pelvis was performed with the  administration of 100 mL of iohexol (OMNIPAQUE) 300 MG/ML solution. Multiplanar reformatted images are provided for review. Automated exposure control, iterative reconstruction, and/or weight-based adjustment of the mA/kV was utilized to reduce the radiation dose to as low as reasonably achievable. COMPARISON: None available. CLINICAL HISTORY: Hernia suspected, inguinal or femoral. FINDINGS: LOWER CHEST: No acute abnormality. LIVER: The liver is unremarkable. GALLBLADDER AND BILE DUCTS: Gallbladder is unremarkable. No biliary ductal dilatation. SPLEEN: No acute abnormality. PANCREAS: No acute abnormality. ADRENAL GLANDS: No acute abnormality. KIDNEYS, URETERS AND BLADDER: No stones in the kidneys or ureters. No  hydronephrosis. No perinephric or periureteral stranding. Urinary bladder is unremarkable. GI AND BOWEL: The appendix is not clearly identified; however, there are no secondary signs of appendicitis within the right lower quadrant. The stomach, small bowel, and large bowel are otherwise unremarkable. PERITONEUM AND RETROPERITONEUM: No ascites. No free air. VASCULATURE: Aorta is normal in caliber. LYMPH NODES: No lymphadenopathy. REPRODUCTIVE ORGANS: No acute abnormality. BONES AND SOFT TISSUES: No acute osseous abnormality. A small fat-containing indirect left inguinal hernia is present with infiltration of the herniated fat, which may reflect changes of incarceration. This is best appreciated on coronal image 63/4. IMPRESSION: 1. Small fat-containing indirect left inguinal hernia with infiltrated herniated fat concerning for incarceration. Electronically signed by: Dorethia Molt MD 03/30/2024 02:47 AM EST RP Workstation: [id number]     Procedures   Medications Ordered in the ED  morphine  (PF) 4 MG/ML injection 4 mg (4 mg Intravenous Given 03/30/24 0224)  ondansetron  (ZOFRAN ) injection 4 mg (4 mg Intravenous Given 03/30/24 0224)  iohexol (OMNIPAQUE) 300 MG/ML solution 100 mL (100 mLs Intravenous Contrast Given 03/30/24 0231)    Clinical Course as of 03/30/24 0333  Thu Mar 30, 2024  0315 Was able to reduce the hernia at the bedside without difficulty.  No overlying skin changes.  Patient without ongoing pain.  Will have him follow-up with general surgery.  We discussed avoiding increasing intra-abdominal pressure including Valsalva or heavy lifting. [CH]    Clinical Course User Index [CH] Sharilynn Cassity, Charmaine FALCON, MD                                 Medical Decision Making Amount and/or Complexity of Data Reviewed Radiology: ordered.  Risk Prescription drug management.   This patient presents to the ED for concern of left groin pain and swelling, this involves an extensive number of treatment  options, and is a complaint that carries with it a high risk of complications and morbidity.  I considered the following differential and admission for this acute, potentially life threatening condition.  The differential diagnosis includes hernia, incarcerated hernia, adenitis or UTI  MDM:    This is a 33 year old male who presents with bulge in the left groin.  He is nontoxic and vital signs are reassuring.  Labs reviewed from [facility] is reassuring.  No leukocytosis.  He does appear to have an inguinal hernia on the left.  He has some pain initially with palpation but no overlying skin changes.  Able to reduce the hernia at the bedside.  Given persistent pain, CT was obtained.  CT does show small hernia with fat noted and potential concern for incarceration.  Again at the bedside the hernia is reducible without overlying skin changes.  Does not have persistent pain.  Discussed with the patient that he should follow-up with general surgery.  Generally avoid significant increases in intra-abdominal pressure or Valsalva  which will likely cause recurrent herniation.  He was given return precautions.  (Labs, imaging, consults)  Labs: I Ordered, and personally interpreted labs.  The pertinent results include: CBC, CMP, lipase, urinalysis  Imaging Studies ordered: I ordered imaging studies including CT imaging I independently visualized and interpreted imaging. I agree with the radiologist interpretation  Additional history obtained from chart review.  External records from outside source obtained and reviewed including prior labs  Cardiac Monitoring: The patient was not maintained on a cardiac monitor.  If on the cardiac monitor, I personally viewed and interpreted the cardiac monitored which showed an underlying rhythm of: N/A  Reevaluation: After the interventions noted above, I reevaluated the patient and found that they have :improved  Social Determinants of Health:  lives  independently  Disposition: Discharged with surgery follow-up  Co morbidities that complicate the patient evaluation  Past Medical History:  Diagnosis Date   Chlamydia    Eczema    Gonorrhea      Medicines Meds ordered this encounter  Medications   morphine  (PF) 4 MG/ML injection 4 mg   ondansetron  (ZOFRAN ) injection 4 mg   iohexol (OMNIPAQUE) 300 MG/ML solution 100 mL    I have reviewed the patients home medicines and have made adjustments as needed  Problem List / ED Course: Problem List Items Addressed This Visit   None Visit Diagnoses       Unilateral recurrent inguinal hernia without obstruction or gangrene    -  Primary                Final diagnoses:  Unilateral recurrent inguinal hernia without obstruction or gangrene    ED Discharge Orders     None          Bari Charmaine FALCON, MD 03/30/24 [phone]
